# Patient Record
Sex: Female | Born: 2005 | Race: Black or African American | Marital: Single | Smoking: Never smoker
Health system: Southern US, Community
[De-identification: ages and names within clinical notes are randomized; demographics above are authoritative.]

## PROBLEM LIST (undated history)

## (undated) DIAGNOSIS — J302 Other seasonal allergic rhinitis: Secondary | ICD-10-CM

## (undated) HISTORY — PX: TONSILLECTOMY AND ADENOIDECTOMY: SUR1326

## (undated) HISTORY — DX: Other seasonal allergic rhinitis: J30.2

---
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
## 2023-03-14 ENCOUNTER — Ambulatory Visit (INDEPENDENT_AMBULATORY_CARE_PROVIDER_SITE_OTHER): Payer: Medicaid Other | Admitting: Medical

## 2023-03-14 VITALS — BP 112/68 | HR 100 | Temp 98.7°F | Ht 68.0 in | Wt 203.0 lb

## 2023-03-14 DIAGNOSIS — R309 Painful micturition, unspecified: Secondary | ICD-10-CM

## 2023-03-14 DIAGNOSIS — N898 Other specified noninflammatory disorders of vagina: Secondary | ICD-10-CM

## 2023-03-14 DIAGNOSIS — N920 Excessive and frequent menstruation with regular cycle: Secondary | ICD-10-CM | POA: Diagnosis not present

## 2023-03-14 DIAGNOSIS — Z113 Encounter for screening for infections with a predominantly sexual mode of transmission: Secondary | ICD-10-CM

## 2023-03-14 DIAGNOSIS — R350 Frequency of micturition: Secondary | ICD-10-CM

## 2023-03-14 DIAGNOSIS — Z30011 Encounter for initial prescription of contraceptive pills: Secondary | ICD-10-CM

## 2023-03-14 LAB — POCT URINALYSIS DIP (PROADVANTAGE DEVICE)
Bilirubin, UA: NEGATIVE
Blood, UA: NEGATIVE
Glucose, UA: NEGATIVE mg/dL
Ketones, POC UA: NEGATIVE mg/dL
Nitrite, UA: NEGATIVE
Protein Ur, POC: NEGATIVE mg/dL
Specific Gravity, Urine: 1.025
Urobilinogen, Ur: 0.2
pH, UA: 6 (ref 5.0–8.0)

## 2023-03-14 LAB — POCT WET PREP (WET MOUNT): Trichomonas Wet Prep HPF POC: ABSENT

## 2023-03-14 LAB — POCT URINE PREGNANCY: Preg Test, Ur: NEGATIVE

## 2023-03-14 MED ORDER — NORGESTIM-ETH ESTRAD TRIPHASIC 0.18/0.215/0.25 MG-25 MCG PO TABS: 1.0000 | ORAL_TABLET | Freq: Every day | ORAL | 11 refills | Status: DC

## 2023-03-14 NOTE — Progress Notes (Signed)
Subjective:  Laura Richards is a 17 y.o. female who presents for Chief Complaint  Patient presents with   Establish Care    New patient to establish care. She thinks she may have UTI, having pain when she urinates and frequency. Also wanting to start birth control due to painful/heavy periods.      Here as a new patient today  Her mother asked her to come in for appt.  She notes lower abdominal discomfort 3 days.  No burning with urination but has sharp pains at times.   No blood in urine, no odor in urine.  No fever, no body aches or chills.  Has possibly had UTI in the past, but no prior treatment for this.    Gets some vaginal discharge, but not out of the ordinary.   Sexually active, uses condom every time.   Not on birth control, no prior birth control.  Has 1 partner x a little over a year.  No prior testing for STD.   LMP May 5.  Gets painful and heavy periods. Periods are regular.   Every menstrual period is heavy, also gets lightheaded with periods.  Sometimes gets nausea and vomiting during menstrual periods.  No ice cravings or other weird cravings.   No hx/o anemia.  No prior pregnancy.    Smokes marijuana.  Doesn't smoke cigarettes.  No other aggravating or relieving factors.    No other c/o.  The following portions of the patient's history were reviewed and updated as appropriate: allergies, current medications, past family history, past medical history, past social history, past surgical history and problem list.  ROS Otherwise as in subjective above  Objective: BP 112/68   Pulse 100   Temp 98.7 F (37.1 C) (Tympanic)   Ht 5\' 8"  (1.727 m)   Wt 203 lb (92.1 kg)   LMP 02/19/2023   SpO2 99%   BMI 30.87 kg/m   General appearance: alert, no distress, well developed, well nourished Neck: supple, no lymphadenopathy, no thyromegaly, no masses Heart: RRR, normal S1, S2, no murmurs Lungs: CTA bilaterally, no wheezes, rhonchi, or rales Abdomen: +bs, soft, mild  generalized lower abdominal tendnerss, otherwise non tender, non distended, no masses, no hepatomegaly, no splenomegaly Pulses: 2+ radial pulses, 2+ pedal pulses, normal cap refill Ext: no edema  Gyn: Normal external genitalia without lesions, vagina with normal mucosa, cervix without lesions, no cervical motion tenderness, mild whitish creamy vaginal discharge.  Uterus and adnexa not enlarged, nontender, no masses.   Exam chaperoned by nurse. Rectal: anus normal appearing     Assessment: Encounter Diagnoses  Name Primary?   Pain with urination Yes   Urinary frequency    Vaginal discharge    Encounter for initial prescription of contraceptive pills    Menorrhagia with regular cycle    Screen for STD (sexually transmitted disease)      Plan: Labs as below Counseled on safe sex, prevention, condom use Counseling on symptoms of STD vs UTI  Counseling on contraception, risks, benefits, types.  She does not want IUD or nexplanon.   Willing to try OCP.  Begin next Sunday as her next menstrual periods should be in the next week  Urine pregnancy negative today   Manna was seen today for establish care.  Diagnoses and all orders for this visit:  Pain with urination -     POCT Urinalysis DIP (Proadvantage Device) -     POCT Wet Prep [facility]) -     POCT urine  pregnancy -     CULTURE, URINE COMPREHENSIVE  Urinary frequency -     POCT Urinalysis DIP (Proadvantage Device) -     POCT Wet Prep [facility]) -     POCT urine pregnancy  Vaginal discharge -     POCT Wet Prep [facility])  Encounter for initial prescription of contraceptive pills -     POCT urine pregnancy  Menorrhagia with regular cycle -     POCT urine pregnancy  Screen for STD (sexually transmitted disease) -     POCT Wet Prep [facility]) -     RPR+HIV+GC+CT Panel -     POCT urine pregnancy  Other orders -     Norgestimate-Ethinyl Estradiol Triphasic (ORTHO TRI-CYCLEN LO) 0.18/0.215/0.25 MG-25 MCG  tab; Take 1 tablet by mouth daily.   Follow up: pending labs

## 2023-03-15 LAB — RPR+HIV+GC+CT PANEL
Chlamydia trachomatis, NAA: NEGATIVE
HIV Screen 4th Generation wRfx: NONREACTIVE
Neisseria Gonorrhoeae by PCR: NEGATIVE
RPR Ser Ql: NONREACTIVE

## 2023-03-16 ENCOUNTER — Other Ambulatory Visit: Payer: Self-pay | Admitting: Medical

## 2023-03-16 LAB — CULTURE, URINE COMPREHENSIVE

## 2023-03-16 MED ORDER — NITROFURANTOIN MONOHYD MACRO 100 MG PO CAPS: 100.0000 mg | ORAL_CAPSULE | Freq: Two times a day (BID) | ORAL | 0 refills | Status: DC

## 2023-03-16 MED ORDER — METRONIDAZOLE 500 MG PO TABS: ORAL_TABLET | ORAL | 0 refills | Status: DC

## 2023-03-16 NOTE — Progress Notes (Signed)
Results sent through MyChart

## 2024-02-24 ENCOUNTER — Other Ambulatory Visit: Payer: Self-pay | Admitting: Medical

## 2024-02-26 ENCOUNTER — Encounter: Payer: Self-pay | Admitting: Internal Medicine

## 2024-04-15 ENCOUNTER — Other Ambulatory Visit: Payer: Self-pay | Admitting: Medical

## 2024-04-25 ENCOUNTER — Other Ambulatory Visit: Payer: Self-pay | Admitting: Medical

## 2024-04-25 NOTE — Telephone Encounter (Signed)
 Pt read my message back in May 2025 that she was due for an appt and needed to schedule

## 2024-04-25 NOTE — Telephone Encounter (Signed)
 Patient has not been seen in over a year.Please schedule.

## 2024-04-26 ENCOUNTER — Other Ambulatory Visit: Payer: Self-pay | Admitting: Medical

## 2024-04-26 ENCOUNTER — Telehealth: Payer: Self-pay | Admitting: Medical

## 2024-04-26 MED ORDER — NORGESTIM-ETH ESTRAD TRIPHASIC 0.18/0.215/0.25 MG-25 MCG PO TABS: 1.0000 | ORAL_TABLET | Freq: Every day | ORAL | 0 refills | Status: DC

## 2024-04-26 NOTE — Telephone Encounter (Signed)
 Patient called back about 7/17 appt, she needs physical which needs to be with PCP. Pt states she only saw Ludie originally was because he was the only provider taking  new patients. Advised her that I can ask Dr Randol about becoming PCP but she does need physical and Ludie can do her physical on 7/17 but Dr Randol does not have an opening for a physical at that time Patient states she will call back, gave her my name and told her to ask for me

## 2024-04-26 NOTE — Telephone Encounter (Signed)
 Copied from CRM [id number]. Topic: Clinical - Prescription Issue >> Apr 26, 2024  1:36 PM Laura Richards wrote: Reason for CRM: Patient is scheduled for a physical next week but is out of her birth control, Norgestimate-Ethinyl Estradiol Triphasic (ORTHO TRI-CYCLEN LO) 0.18/0.215/0.25 MG-25 MCG tab. Is asking if she can get 1 refill just until she is seen so she doesn't miss taking it.   Patient can be reached at [phone]

## 2024-05-01 ENCOUNTER — Ambulatory Visit (INDEPENDENT_AMBULATORY_CARE_PROVIDER_SITE_OTHER): Admitting: Medical

## 2024-05-01 ENCOUNTER — Encounter: Payer: Self-pay | Admitting: Medical

## 2024-05-01 VITALS — BP 110/64 | HR 66 | Ht 68.25 in | Wt 165.8 lb

## 2024-05-01 DIAGNOSIS — Z8742 Personal history of other diseases of the female genital tract: Secondary | ICD-10-CM | POA: Diagnosis not present

## 2024-05-01 DIAGNOSIS — Z13220 Encounter for screening for lipoid disorders: Secondary | ICD-10-CM | POA: Diagnosis not present

## 2024-05-01 DIAGNOSIS — Z136 Encounter for screening for cardiovascular disorders: Secondary | ICD-10-CM

## 2024-05-01 DIAGNOSIS — Z0000 Encounter for general adult medical examination without abnormal findings: Secondary | ICD-10-CM | POA: Diagnosis not present

## 2024-05-01 DIAGNOSIS — Z309 Encounter for contraceptive management, unspecified: Secondary | ICD-10-CM | POA: Diagnosis not present

## 2024-05-01 LAB — POCT URINE PREGNANCY: Preg Test, Ur: NEGATIVE

## 2024-05-01 LAB — LIPID PANEL

## 2024-05-01 MED ORDER — NORGESTIM-ETH ESTRAD TRIPHASIC 0.18/0.215/0.25 MG-25 MCG PO TABS: 1.0000 | ORAL_TABLET | Freq: Every day | ORAL | 11 refills | Status: AC

## 2024-05-01 NOTE — Addendum Note (Signed)
 Addended by: BULAH ALM RAMAN on: 05/01/2024 01:07 PM   Modules accepted: Orders

## 2024-05-01 NOTE — Progress Notes (Signed)
 Subjective:   HPI  Laura Richards is a 18 y.o. female who presents for Chief Complaint  Patient presents with  . Annual Exam    Cpe, needs refill on birth control medication. Only have a few immunizations in [place]. Patient from [place]    Patient Care Team: Yianna Tersigni, Alm GORMAN RIGGERS as PCP - General (Family Medicine) Dentist Eye doctor   Concerns: Doing well  Needs refill on birth control.  Periods were heavy before starting OCP last year. Does well on OCP.  No sexual activity since last visit here last year .   LMP 04/19/24  Gynecological history: No prior pregnancies  She notes that she got healthy with diet and exercise last year and has lost 30lb since last year  Reviewed their medical, surgical, family, social, medication, and allergy history and updated chart as appropriate.  No Known Allergies  Past Medical History:  Diagnosis Date  . Seasonal allergies     Current Outpatient Medications on File Prior to Visit  Medication Sig Dispense Refill  . Norgestim-Eth Estrad Triphasic (NORGESTIMATE-ETHINYL ESTRADIOL TRIPHASIC) 0.18/0.215/0.25 MG-25 MCG tab Take 1 tablet by mouth daily. 28 tablet 0  . metroNIDAZOLE  (FLAGYL ) 500 MG tablet 4 tablets once, don't consume alcohol while taking the medication 4 tablet 0  . nitrofurantoin , macrocrystal-monohydrate, (MACROBID ) 100 MG capsule Take 1 capsule (100 mg total) by mouth 2 (two) times daily. 14 capsule 0   No current facility-administered medications on file prior to visit.      Current Outpatient Medications:  .  Norgestim-Eth Estrad Triphasic (NORGESTIMATE-ETHINYL ESTRADIOL TRIPHASIC) 0.18/0.215/0.25 MG-25 MCG tab, Take 1 tablet by mouth daily., Disp: 28 tablet, Rfl: 0 .  metroNIDAZOLE  (FLAGYL ) 500 MG tablet, 4 tablets once, don't consume alcohol while taking the medication, Disp: 4 tablet, Rfl: 0 .  nitrofurantoin , macrocrystal-monohydrate, (MACROBID ) 100 MG capsule, Take 1 capsule (100 mg total) by mouth 2 (two) times  daily., Disp: 14 capsule, Rfl: 0  Family History  Problem Relation Age of Onset  . Other Father        accidental death  . Cancer Maternal Uncle   . Diabetes Maternal Grandfather   . Heart disease Neg Hx     Past Surgical History:  Procedure Laterality Date  . TONSILLECTOMY AND ADENOIDECTOMY        Review of Systems  Constitutional:  Negative for chills, fever, malaise/fatigue and weight loss.  HENT:  Negative for congestion, ear pain, hearing loss, sore throat and tinnitus.   Eyes:  Negative for blurred vision, pain and redness.  Respiratory:  Negative for cough, hemoptysis and shortness of breath.   Cardiovascular:  Negative for chest pain, palpitations, orthopnea, claudication and leg swelling.  Gastrointestinal:  Negative for abdominal pain, blood in stool, constipation, diarrhea, nausea and vomiting.  Genitourinary:  Negative for dysuria, flank pain, frequency, hematuria and urgency.  Musculoskeletal:  Negative for falls, joint pain and myalgias.  Skin:  Negative for itching and rash.  Neurological:  Negative for dizziness, tingling, speech change, weakness and headaches.  Endo/Heme/Allergies:  Negative for polydipsia. Does not bruise/bleed easily.  Psychiatric/Behavioral:  Negative for depression and memory loss. The patient is not nervous/anxious and does not have insomnia.         Objective:  BP 110/64   Pulse 66   Ht 5' 8.25 (1.734 m)   Wt 165 lb 12.8 oz (75.2 kg)   LMP 04/19/2024   SpO2 100%   BMI 25.03 kg/m   General appearance: alert, no distress, WD/WN, African American  female Skin: unremarkable HEENT: normocephalic, conjunctiva/corneas normal, sclerae anicteric, PERRLA, EOMi, nares patent, no discharge or erythema, pharynx normal Oral cavity: MMM, tongue normal, teeth normal Neck: supple, no lymphadenopathy, no thyromegaly, no masses, normal ROM, no bruits Chest: non tender, normal shape and expansion Heart: RRR, normal S1, S2, no murmurs Lungs: CTA  bilaterally, no wheezes, rhonchi, or rales Abdomen: +bs, soft, scar from prior umbilical piercing, otherwise non tender, non distended, no masses, no hepatomegaly, no splenomegaly, no bruits Back: non tender, normal ROM, no scoliosis Musculoskeletal: upper extremities non tender, no obvious deformity, normal ROM throughout, lower extremities non tender, no obvious deformity, normal ROM throughout Extremities: no edema, no cyanosis, no clubbing Pulses: 2+ symmetric, upper and lower extremities, normal cap refill Neurological: alert, oriented x 3, CN2-12 intact, strength normal upper extremities and lower extremities, sensation normal throughout, DTRs 2+ throughout, no cerebellar signs, gait normal Psychiatric: normal affect, behavior normal, pleasant  Breast/gyn/rectal - deferred     Assessment and Plan :   Encounter Diagnoses  Name Primary?  . Encounter for health maintenance examination in adult Yes  . Encounter for lipid screening for cardiovascular disease   . Encounter for contraceptive management, unspecified type   . History of heavy periods     This visit was a preventative care visit, also known as wellness visit or routine physical.   Topics typically include healthy lifestyle, diet, exercise, preventative care, vaccinations, sick and well care, proper use of emergency dept and after hours care, as well as other concerns.    Separate significant issues discussed: Hx/o heavy periods.  Doing fine on OCP.  Discussed risks/benefits of medication.   General Recommendations: Continue to return yearly for your annual wellness and preventative care visits.  This gives us  a chance to discuss healthy lifestyle, exercise, vaccinations, review your chart record, and perform screenings where appropriate.  I recommend you see your eye doctor yearly for routine vision care.  I recommend you see your dentist yearly for routine dental care including hygiene visits twice  yearly.   Vaccination recommendations were reviewed Immunization History  Administered Date(s) Administered  . Influenza Split 07/03/2013, 08/15/2014  . MenQuadfi_Meningococcal Groups ACYW Conjugate 06/22/2016, 07/30/2021  . Tdap 06/22/2016    Vaccine recommendations: Get us  a copy of your childhood vaccines   Screening for cancer: Colon cancer screening: Age 51  Breast cancer screening: You should perform a self breast exam monthly.   We reviewed recommendations for regular mammograms and breast cancer screening.  Cervical cancer screening: We reviewed recommendations for pap smear screening. Pap age 3   Skin cancer screening: Check your skin regularly for new changes, growing lesions, or other lesions of concern Come in for evaluation if you have skin lesions of concern.  Lung cancer screening: If you have a greater than 20 pack year history of tobacco use, then you may qualify for lung cancer screening with a chest CT scan.   Please call your insurance company to inquire about coverage for this test.  Pancreatic cancer: no current screening test is available routinely recommended.  (Risk factors: Smoking, overweight or obese, diabetes, chronic pancreatitis, work Nurse, mental health, Solicitor, 72 year old or greater, female greater than female, African-American, family history of pancreatic cancer, hereditary breast, ovarian, melanoma, Lynch, Peutz-jeghers).  We currently don't have screenings for other cancers besides breast, cervical, colon, and lung cancers.  If you have a strong family history of cancer or have other cancer screening concerns, please let me know.    Bone health: Get at  least 150 minutes of aerobic exercise weekly Get weight bearing exercise at least once weekly Bone density test:  A bone density test is an imaging test that uses a type of X-ray to measure the amount of calcium and other minerals in your bones. The test may be used to diagnose or  screen you for a condition that causes weak or thin bones (osteoporosis), predict your risk for a broken bone (fracture), or determine how well your osteoporosis treatment is working. The bone density test is recommended for females 65 and older, or females or males <65 if certain risk factors such as thyroid disease, long term use of steroids such as for asthma or rheumatological issues, vitamin D deficiency, estrogen deficiency, family history of osteoporosis, self or family history of fragility fracture in first degree relative.    Heart health: Get at least 150 minutes of aerobic exercise weekly Limit alcohol It is important to maintain a healthy blood pressure and healthy cholesterol numbers  Heart disease screening: Screening for heart disease includes screening for blood pressure, fasting lipids, glucose/diabetes screening, BMI height to weight ratio, reviewed of smoking status, physical activity, and diet.    Goals include blood pressure 120/80 or less, maintaining a healthy lipid/cholesterol profile, preventing diabetes or keeping diabetes numbers under good control, not smoking or using tobacco products, exercising most days per week or at least 150 minutes per week of exercise, and eating healthy variety of fruits and vegetables, healthy oils, and avoiding unhealthy food choices like fried food, fast food, high sugar and high cholesterol foods.    Other tests may possibly include EKG test, CT coronary calcium score, echocardiogram, exercise treadmill stress test.     Vascular disease screening: For high risk individuals including smokers, diabetes, patients with known heart disease or high blood pressure, kidney disease, and others, screening for vascular disease or atherosclerosis of the arteries is available.  Examples may include carotid ultrasound, abdominal aortic ultrasound, ABI blood flow screening in the legs, thoracic aorta screening.   Medical care options: I recommend  you continue to seek care here first for routine care.  We try really hard to have available appointments Monday through Friday daytime hours for sick visits, acute visits, and physicals.  Urgent care should be used for after hours and weekends for significant issues that cannot wait till the next day.  The emergency department should be used for significant potentially life-threatening emergencies.  The emergency department is expensive, can often have long wait times for less significant concerns, so try to utilize primary care, urgent care, or telemedicine when possible to avoid unnecessary trips to the emergency department.  Virtual visits and telemedicine have been introduced since the pandemic started in 2020, and can be convenient ways to receive medical care.  We offer virtual appointments as well to assist you in a variety of options to seek medical care.   Legal Take the time to do a Last Will and Testament, advanced directives including Healthcare Power of Attorney and Living Will documents.  Do not leave your family with burdens that can be handled ahead of time.   Advanced Directives: I recommend you consider completing a Health Care Power of Attorney and Living Will.   These documents respect your wishes and help alleviate burdens on your loved ones if you were to become terminally ill or be in a position to need those documents enforced.    You can complete Advanced Directives yourself, have them notarized, then have copies made for  our office, for you and for anybody you feel should have them in safe keeping.  Or, you can have an attorney prepare these documents.   If you haven't updated your Last Will and Testament in a while, it may be worthwhile having an attorney prepare these documents together and save on some costs.       Spiritual and Emotional Health Keeping a healthy spiritual life can help you better manage your physical health. Your spiritual life can help you to cope  with any issues that may arise with your physical health.  Balance can keep us  healthy and help us  to recover.  If you are struggling with your spiritual health there are questions that you may want to ask yourself:  What makes me feel most complete? When do I feel most connected to the rest of the world? Where do I find the most inner strength? What am I doing when I feel whole?  Helpful tips: Being in nature. Some people feel very connected and at peace when they are walking outdoors or are outside. Helping others. Some feel the largest sense of wellbeing when they are of service to others. Being of service can take on many forms. It can be doing volunteer work, being kind to strangers, or offering a hand to a friend in need. Gratitude. Some people find they feel the most connected when they remain grateful. They may make lists of all the things they are grateful for or say a thank you out loud for all they have.    Emotional Health Are you in tune with your emotional health?  Check out this link: http://www.marquez-love.com/   Financial Health Make sure you use a budget for your personal finances Make sure you are insured against risks (health insurance, life insurance, auto insurance, etc) Save more, spend less Set financial goals If you need help in this area, good resources include counseling through [employer] or other community resources, have a meeting with a Social research officer, government, and a good resource is Scientist, physiological    Lindsea was seen today for annual exam.  Diagnoses and all orders for this visit:  Encounter for health maintenance examination in adult -     Comprehensive metabolic panel with GFR -     CBC -     Lipid panel -     TSH -     POCT urine pregnancy  Encounter for lipid screening for cardiovascular disease -     Lipid panel  Encounter for contraceptive management, unspecified type -     POCT urine pregnancy  History of  heavy periods   Follow-up pending labs, yearly for physical

## 2024-05-01 NOTE — Patient Instructions (Signed)

## 2024-05-02 ENCOUNTER — Ambulatory Visit: Payer: Self-pay | Admitting: Medical

## 2024-05-02 LAB — CBC
Hematocrit: 45.5 % (ref 34.0–46.6)
Hemoglobin: 14 g/dL (ref 11.1–15.9)
MCH: 29.4 pg (ref 26.6–33.0)
MCHC: 30.8 g/dL — ABNORMAL LOW (ref 31.5–35.7)
MCV: 96 fL (ref 79–97)
Platelets: 317 x10E3/uL (ref 150–450)
RBC: 4.76 x10E6/uL (ref 3.77–5.28)
RDW: 11.8 % (ref 11.7–15.4)
WBC: 8 x10E3/uL (ref 3.4–10.8)

## 2024-05-02 LAB — COMPREHENSIVE METABOLIC PANEL WITH GFR
ALT: 17 IU/L (ref 0–32)
AST: 13 IU/L (ref 0–40)
Albumin: 4.5 g/dL (ref 4.0–5.0)
Alkaline Phosphatase: 80 IU/L (ref 42–106)
BUN/Creatinine Ratio: 13 (ref 9–23)
BUN: 11 mg/dL (ref 6–20)
Bilirubin Total: 0.4 mg/dL (ref 0.0–1.2)
CO2: 22 mmol/L (ref 20–29)
Calcium: 9.8 mg/dL (ref 8.7–10.2)
Chloride: 104 mmol/L (ref 96–106)
Creatinine, Ser: 0.87 mg/dL (ref 0.57–1.00)
Globulin, Total: 2.9 g/dL (ref 1.5–4.5)
Glucose: 78 mg/dL (ref 70–99)
Potassium: 4.4 mmol/L (ref 3.5–5.2)
Sodium: 140 mmol/L (ref 134–144)
Total Protein: 7.4 g/dL (ref 6.0–8.5)
eGFR: 98 mL/min/1.73 (ref >59)

## 2024-05-02 LAB — LIPID PANEL
Cholesterol, Total: 127 mg/dL (ref 100–169)
HDL: 40 mg/dL (ref >39)
LDL CALC COMMENT:: 3.2 ratio (ref 0.0–4.4)
LDL Chol Calc (NIH): 77 mg/dL (ref 0–109)
Triglycerides: 39 mg/dL (ref 0–89)
VLDL Cholesterol Cal: 10 mg/dL (ref 5–40)

## 2024-05-02 LAB — TSH: TSH: 0.918 u[IU]/mL (ref 0.450–4.500)

## 2024-05-02 NOTE — Progress Notes (Signed)
 Results sent through MyChart

## 2024-06-03 ENCOUNTER — Encounter: Payer: Self-pay | Admitting: Medical

## 2024-06-03 DIAGNOSIS — Z1329 Encounter for screening for other suspected endocrine disorder: Secondary | ICD-10-CM | POA: Insufficient documentation

## 2024-08-05 ENCOUNTER — Ambulatory Visit (INDEPENDENT_AMBULATORY_CARE_PROVIDER_SITE_OTHER): Admitting: Medical

## 2024-08-05 ENCOUNTER — Ambulatory Visit (HOSPITAL_COMMUNITY)
Admission: EM | Admit: 2024-08-05 | Discharge: 2024-08-05 | Disposition: A | Discharge status: Home / Self Care | Attending: Emergency Medicine | Admitting: Emergency Medicine

## 2024-08-05 VITALS — BP 120/80 | HR 84 | Wt 165.6 lb

## 2024-08-05 DIAGNOSIS — L989 Disorder of the skin and subcutaneous tissue, unspecified: Secondary | ICD-10-CM | POA: Diagnosis not present

## 2024-08-05 DIAGNOSIS — S29012A Strain of muscle and tendon of back wall of thorax, initial encounter: Secondary | ICD-10-CM

## 2024-08-05 MED ORDER — METHOCARBAMOL 500 MG PO TABS: 500.0000 mg | ORAL_TABLET | Freq: Two times a day (BID) | ORAL | 0 refills | Status: AC

## 2024-08-05 NOTE — Progress Notes (Signed)
 Subjective: Chief Complaint  Patient presents with   Acute Visit    Growth on scalp, sometimes when she does her hair she will hit it and it will bleed.    Laura Richards is a 18 year old female who presents with a growth on her scalp.    She has a growth on her scalp, described as a mole, present for approximately three years.   The growth is prone to bleeding when hit or scratched. It may be getting larger.  No other similar growths are present on her scalp or elsewhere on her body.  Past Medical History:  Diagnosis Date   Seasonal allergies    Current Outpatient Medications on File Prior to Visit  Medication Sig Dispense Refill   Norgestim-Eth Estrad Triphasic (NORGESTIMATE-ETHINYL ESTRADIOL TRIPHASIC) 0.18/0.215/0.25 MG-25 MCG tab Take 1 tablet by mouth daily. 30 tablet 11   No current facility-administered medications on file prior to visit.   ROS as in subjective   Objective: BP 120/80   Pulse 84   Wt 165 lb 9.6 oz (75.1 kg)   BMI 25.00 kg/m   Gen: wd, wn, nad African-American female Hair has rows of locks wound tight On the right posterior scalp there is a approximately 10 mm diameter growth that appears verrucal, pinkish coloration raised with a smaller diameter base    Assessment: Encounter Diagnoses  Name Primary?   Skin lesion Yes   Changing skin lesion      Plan: We discussed the skin lesion that is getting bigger and bleeds sometimes when it is scraped or bumped.  Discussed possible treatment options including excision, cryotherapy, biopsy or referral to dermatology.  She gave consent to pursue cryotherapy after discussing risk and benefits and types of procedure  Cryotherapy -discussed risk and benefits of procedure, and patient gave a consent for cryotherapy of the right superior scalp lesion.  Use Verruca-Freeze to the scalp lesion as per manufacture guidelines.  Discussed wound care.    Follow-up in 6 weeks  [name] was seen today for  acute visit.  Diagnoses and all orders for this visit:  Skin lesion  Changing skin lesion    F/u 6 wk
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
## 2024-08-06 ENCOUNTER — Ambulatory Visit (HOSPITAL_COMMUNITY)

## 2024-09-16 ENCOUNTER — Ambulatory Visit: Payer: Self-pay | Admitting: Medical

## 2024-09-16 ENCOUNTER — Encounter: Payer: Self-pay | Admitting: Medical

## 2024-09-16 ENCOUNTER — Other Ambulatory Visit (HOSPITAL_COMMUNITY)
Admission: RE | Admit: 2024-09-16 | Discharge: 2024-09-16 | Disposition: A | Source: Ambulatory Visit | Attending: Medical | Admitting: Medical

## 2024-09-16 VITALS — BP 102/70 | HR 83 | Wt 173.4 lb

## 2024-09-16 DIAGNOSIS — L989 Disorder of the skin and subcutaneous tissue, unspecified: Secondary | ICD-10-CM | POA: Insufficient documentation

## 2024-09-16 DIAGNOSIS — B079 Viral wart, unspecified: Secondary | ICD-10-CM

## 2024-09-16 NOTE — Progress Notes (Signed)
 Subjective: Chief Complaint  Patient presents with   Follow-up    1 month f/u on skin lesion, it has shrunk some now, on scalp,    Laura Richards is here for follow up on a growth on her scalp.  6 weeks ago we used cryotherapy which did shrink it some, but not completely.   She has a growth on her scalp, described as a mole, present for approximately three years.   The growth is prone to bleeding when hit or scratched. It may be getting larger.  No other similar growths are present on her scalp or elsewhere on her body.  Past Medical History:  Diagnosis Date   Seasonal allergies    Current Outpatient Medications on File Prior to Visit  Medication Sig Dispense Refill   methocarbamol  (ROBAXIN ) 500 MG tablet Take 1 tablet (500 mg total) by mouth 2 (two) times daily. 20 tablet 0   Norgestim-Eth Estrad Triphasic (NORGESTIMATE-ETHINYL ESTRADIOL TRIPHASIC) 0.18/0.215/0.25 MG-25 MCG tab Take 1 tablet by mouth daily. 30 tablet 11   No current facility-administered medications on file prior to visit.   ROS as in subjective   Objective: BP 102/70   Pulse 83   Wt 173 lb 6.4 oz (78.7 kg)   BMI 26.17 kg/m   Gen: wd, wn, nad African-American female Hair has rows of locks wound tight On the right posterior scalp there is a approximately 6 mm diameter growth that appears verrucal, pinkish coloration raised with a smaller diameter base    Assessment: Encounter Diagnoses  Name Primary?   Skin lesion of scalp Yes   Verruca       Plan: We discussed the skin lesion that is getting bigger and bleeds sometimes when it is scraped or bumped.  Discussed possible treatment options including excision, cryotherapy, biopsy or referral to dermatology.  She gave consent to pursue excision today after discussing risk and benefits and types of procedure  Procedure (excisional biopsy):  Cleaned and prepped right parietal posterior scalp in usual sterile fashion, used 2% lidocaine with  epinephrine for local anesthesia.   Used excisional biopsy to remove the lesion.  Used hyfrecator to achieve hemostasis.  Cleaned the wound area. Covered with bacitracin ointment.  discussed wound care.  Pt tolerated procedure well.  <3 cc EBL.   Makinsley was seen today for follow-up.  Diagnoses and all orders for this visit:  Skin lesion of scalp -     Surgical pathology  Verruca -     Surgical pathology    F/u pending pathology

## 2024-09-17 LAB — SURGICAL PATHOLOGY

## 2024-09-18 ENCOUNTER — Ambulatory Visit: Payer: Self-pay | Admitting: Medical

## 2024-09-18 NOTE — Progress Notes (Signed)
 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  Fortunately it is benign  Lets plan to look at her skin in general at her next physical or sometime into the new year to evaluate for any other unusual moles.  She is certainly welcome to come back in for a biopsy follow-up in a few weeks if desired just to make sure thing looks okay but given that we cauterized the base this should heal up just fine.

## 2024-11-11 ENCOUNTER — Encounter: Admitting: Obstetrics and Gynecology

## 2024-11-12 ENCOUNTER — Encounter: Admitting: Obstetrics & Gynecology

## 2024-12-23 ENCOUNTER — Encounter: Admitting: Obstetrics and Gynecology
